# Patient Record
Sex: Female | Born: 1955 | Race: Black or African American | Hispanic: No | Marital: Married | Smoking: Never smoker
Health system: Southern US, Community
[De-identification: ages and names within clinical notes are randomized; demographics above are authoritative.]

## PROBLEM LIST (undated history)

## (undated) DIAGNOSIS — C73 Malignant neoplasm of thyroid gland: Secondary | ICD-10-CM

## (undated) DIAGNOSIS — E079 Disorder of thyroid, unspecified: Secondary | ICD-10-CM

## (undated) HISTORY — PX: THYROIDECTOMY: SHX17

---

## 2007-09-05 ENCOUNTER — Emergency Department (HOSPITAL_COMMUNITY): Admission: EM | Admit: 2007-09-05 | Discharge: 2007-09-05 | Payer: Self-pay | Admitting: Family Medicine

## 2010-11-08 ENCOUNTER — Other Ambulatory Visit (HOSPITAL_COMMUNITY): Payer: Self-pay | Admitting: Internal Medicine

## 2010-11-08 DIAGNOSIS — Z1231 Encounter for screening mammogram for malignant neoplasm of breast: Secondary | ICD-10-CM

## 2010-11-16 ENCOUNTER — Ambulatory Visit (HOSPITAL_COMMUNITY)
Admission: RE | Admit: 2010-11-16 | Discharge: 2010-11-16 | Disposition: A | Discharge status: Home / Self Care | Payer: Self-pay | Source: Ambulatory Visit | Attending: Internal Medicine | Admitting: Internal Medicine

## 2010-11-16 DIAGNOSIS — Z1231 Encounter for screening mammogram for malignant neoplasm of breast: Secondary | ICD-10-CM

## 2010-12-06 ENCOUNTER — Emergency Department (HOSPITAL_COMMUNITY): Payer: Self-pay

## 2010-12-06 ENCOUNTER — Emergency Department (HOSPITAL_COMMUNITY)
Admission: EM | Admit: 2010-12-06 | Discharge: 2010-12-06 | Disposition: A | Discharge status: Home / Self Care | Payer: Self-pay | Attending: Emergency Medicine | Admitting: Emergency Medicine

## 2010-12-06 DIAGNOSIS — S92919A Unspecified fracture of unspecified toe(s), initial encounter for closed fracture: Secondary | ICD-10-CM | POA: Insufficient documentation

## 2010-12-06 DIAGNOSIS — IMO0002 Reserved for concepts with insufficient information to code with codable children: Secondary | ICD-10-CM | POA: Insufficient documentation

## 2010-12-06 DIAGNOSIS — Z79899 Other long term (current) drug therapy: Secondary | ICD-10-CM | POA: Insufficient documentation

## 2010-12-06 DIAGNOSIS — Y9302 Activity, running: Secondary | ICD-10-CM | POA: Insufficient documentation

## 2010-12-06 DIAGNOSIS — E039 Hypothyroidism, unspecified: Secondary | ICD-10-CM | POA: Insufficient documentation

## 2010-12-06 DIAGNOSIS — W010XXA Fall on same level from slipping, tripping and stumbling without subsequent striking against object, initial encounter: Secondary | ICD-10-CM | POA: Insufficient documentation

## 2015-04-30 ENCOUNTER — Encounter (HOSPITAL_COMMUNITY): Payer: Self-pay | Admitting: *Deleted

## 2015-04-30 ENCOUNTER — Emergency Department (HOSPITAL_COMMUNITY): Payer: Non-veteran care

## 2015-04-30 DIAGNOSIS — Z85850 Personal history of malignant neoplasm of thyroid: Secondary | ICD-10-CM | POA: Diagnosis not present

## 2015-04-30 DIAGNOSIS — R109 Unspecified abdominal pain: Secondary | ICD-10-CM | POA: Insufficient documentation

## 2015-04-30 DIAGNOSIS — Z79899 Other long term (current) drug therapy: Secondary | ICD-10-CM | POA: Diagnosis not present

## 2015-04-30 DIAGNOSIS — E079 Disorder of thyroid, unspecified: Secondary | ICD-10-CM | POA: Insufficient documentation

## 2015-04-30 LAB — URINALYSIS, ROUTINE W REFLEX MICROSCOPIC
BILIRUBIN URINE: NEGATIVE
Glucose, UA: NEGATIVE mg/dL
HGB URINE DIPSTICK: NEGATIVE
KETONES UR: NEGATIVE mg/dL
Leukocytes, UA: NEGATIVE
Nitrite: NEGATIVE
Protein, ur: NEGATIVE mg/dL
SPECIFIC GRAVITY, URINE: 1.016 (ref 1.005–1.030)
UROBILINOGEN UA: 0.2 mg/dL (ref 0.0–1.0)
pH: 6 (ref 5.0–8.0)

## 2015-04-30 NOTE — ED Notes (Signed)
Pt c/o right sided flank pain intermitent x 1 week. States it got worse yesterday.

## 2015-05-01 ENCOUNTER — Emergency Department (HOSPITAL_COMMUNITY)
Admission: EM | Admit: 2015-05-01 | Discharge: 2015-05-01 | Disposition: A | Discharge status: Home / Self Care | Payer: Non-veteran care | Attending: Emergency Medicine | Admitting: Emergency Medicine

## 2015-05-01 ENCOUNTER — Emergency Department (HOSPITAL_COMMUNITY): Payer: Non-veteran care

## 2015-05-01 DIAGNOSIS — R109 Unspecified abdominal pain: Secondary | ICD-10-CM

## 2015-05-01 HISTORY — DX: Malignant neoplasm of thyroid gland: C73

## 2015-05-01 HISTORY — DX: Disorder of thyroid, unspecified: E07.9

## 2015-05-01 LAB — CBC WITH DIFFERENTIAL/PLATELET
BASOS PCT: 0 %
Basophils Absolute: 0 10*3/uL (ref 0.0–0.1)
Eosinophils Absolute: 0.1 10*3/uL (ref 0.0–0.7)
Eosinophils Relative: 1 %
HEMATOCRIT: 41.6 % (ref 36.0–46.0)
HEMOGLOBIN: 13.6 g/dL (ref 12.0–15.0)
Lymphocytes Relative: 44 %
Lymphs Abs: 3.9 10*3/uL (ref 0.7–4.0)
MCH: 28.1 pg (ref 26.0–34.0)
MCHC: 32.7 g/dL (ref 30.0–36.0)
MCV: 86 fL (ref 78.0–100.0)
MONOS PCT: 7 %
Monocytes Absolute: 0.7 10*3/uL (ref 0.1–1.0)
NEUTROS ABS: 4.2 10*3/uL (ref 1.7–7.7)
NEUTROS PCT: 47 %
Platelets: 292 10*3/uL (ref 150–400)
RBC: 4.84 MIL/uL (ref 3.87–5.11)
RDW: 14.4 % (ref 11.5–15.5)
WBC: 8.9 10*3/uL (ref 4.0–10.5)

## 2015-05-01 LAB — BASIC METABOLIC PANEL
ANION GAP: 9 (ref 5–15)
BUN: 13 mg/dL (ref 6–20)
CHLORIDE: 103 mmol/L (ref 101–111)
CO2: 25 mmol/L (ref 22–32)
CREATININE: 0.81 mg/dL (ref 0.44–1.00)
Calcium: 9.5 mg/dL (ref 8.9–10.3)
GFR calc non Af Amer: >60 mL/min (ref >60)
Glucose, Bld: 101 mg/dL — ABNORMAL HIGH (ref 65–99)
POTASSIUM: 3.8 mmol/L (ref 3.5–5.1)
SODIUM: 137 mmol/L (ref 135–145)

## 2015-05-01 MED ORDER — SODIUM CHLORIDE 0.9 % IV BOLUS (SEPSIS)
1000.0000 mL | Freq: Once | INTRAVENOUS | Status: AC
  Administered 2015-05-01: 1000 mL via INTRAVENOUS

## 2015-05-01 MED ORDER — IOHEXOL 350 MG/ML SOLN
100.0000 mL | Freq: Once | INTRAVENOUS | Status: AC | PRN
  Administered 2015-05-01: 100 mL via INTRAVENOUS

## 2015-05-01 NOTE — ED Notes (Signed)
Patient transported to CT 

## 2015-05-01 NOTE — ED Notes (Signed)
MD at bedside. 

## 2015-05-01 NOTE — ED Provider Notes (Signed)
CSN: [id number]   Arrival date & time 04/30/15 2120  History  By signing my name below, I, Altamease Oiler, attest that this documentation has been prepared under the direction and in the presence of Everlene Balls, MD. Electronically Signed: Altamease Oiler, ED Scribe. 05/01/2015. 1:36 AM.  Chief Complaint  Patient presents with  . Flank Pain    HPI The history is provided by the patient. No language interpreter was used.   Elaine Hunter is a 59 y.o. female who presents to the Emergency Department complaining of intermittent right flank pain with onset 1 week ago. The pain worsened yesterday and is described as burning. She rates the pain 7/10 in severity with no modifying factors. The pain does not radiate to the abdomen. She had similar pain 1 year ago that resolved spontaneously. No recent, trauma, fall, or strain. Pt denies fever, cough, chest pain, nausea, vomiting, diarrhea, dysuria, hematuria, or increased frequency.  Past Medical History  Diagnosis Date  . Thyroid disease   . Thyroid cancer [facility])     Past Surgical History  Procedure Laterality Date  . Thyroidectomy      No family history on file.  Social History  Substance Use Topics  . Smoking status: Never Smoker   . Smokeless tobacco: None  . Alcohol Use: No     Review of Systems  Constitutional: Negative for fever and chills.  Respiratory: Negative for cough.   Cardiovascular: Negative for chest pain.  Gastrointestinal: Negative for nausea, vomiting and diarrhea.  Genitourinary: Positive for flank pain. Negative for frequency and hematuria.  10 Systems reviewed and all are negative for acute change except as noted in the HPI. Home Medications   Prior to Admission medications   Medication Sig Start Date End Date Taking? Authorizing Provider  levothyroxine (SYNTHROID, LEVOTHROID) 100 MCG tablet Take 100 mcg by mouth daily before breakfast.   Yes Historical Provider, MD  Multiple Vitamin (MULTIVITAMIN WITH  MINERALS) TABS tablet Take 1 tablet by mouth daily.   Yes Historical Provider, MD    Allergies  Codeine  Triage Vitals: BP 127/91 mmHg  Pulse 92  Temp(Src) 98.4 F (36.9 C) (Oral)  Resp 16  SpO2 96%  Physical Exam  Constitutional: He is oriented to person, place, and time. Vital signs are normal. He appears well-developed and well-nourished.  Non-toxic appearance. He does not appear ill. No distress.  HENT:  Head: Normocephalic and atraumatic.  Nose: Nose normal.  Mouth/Throat: Oropharynx is clear and moist. No oropharyngeal exudate.  Eyes: Conjunctivae and EOM are normal. Pupils are equal, round, and reactive to light. No scleral icterus.  Neck: Normal range of motion. Neck supple. No tracheal deviation, no edema, no erythema and normal range of motion present. No thyroid mass and no thyromegaly present.  Cardiovascular: Normal rate, regular rhythm, S1 normal, S2 normal, normal heart sounds, intact distal pulses and normal pulses.  Exam reveals no gallop and no friction rub.   No murmur heard. Pulses:      Radial pulses are 2+ on the right side, and 2+ on the left side.       Dorsalis pedis pulses are 2+ on the right side, and 2+ on the left side.  Pulmonary/Chest: Effort normal and breath sounds normal. No respiratory distress. He has no wheezes. He has no rhonchi. He has no rales.  Abdominal: Soft. Normal appearance and bowel sounds are normal. She exhibits no distension, no ascites and no mass. There is no hepatosplenomegaly. There is tenderness. There is  no rebound, no guarding and no CVA tenderness.  Mid epigastric TTP  Musculoskeletal: Normal range of motion. He exhibits no edema or tenderness.  Lymphadenopathy:    He has no cervical adenopathy.  Neurological: He is alert and oriented to person, place, and time. He has normal strength. No cranial nerve deficit or sensory deficit.  Skin: Skin is warm, dry and intact. No petechiae and no rash noted. He is not diaphoretic. No  erythema. No pallor.  Psychiatric: He has a normal mood and affect. His behavior is normal. Judgment normal.  Nursing note and vitals reviewed.   ED Course  Procedures   DIAGNOSTIC STUDIES: Oxygen Saturation is 96% on RA, normal by my interpretation.    COORDINATION OF CARE: 1:31 AM Discussed treatment plan which includes lab work and renal US with pt at bedside and pt agreed to plan.  Labs Reviewed  BASIC METABOLIC PANEL - Abnormal; Notable for the following:    Glucose, Bld 101 (*)    All other components within normal limits  URINALYSIS, ROUTINE W REFLEX MICROSCOPIC (NOT AT [facility])  CBC WITH DIFFERENTIAL/PLATELET    Imaging Review US Renal  04/30/2015  CLINICAL DATA:  Right flank pain EXAM: RENAL / URINARY TRACT ULTRASOUND COMPLETE COMPARISON:  None. FINDINGS: Right Kidney: Length: 7.8 cm. Echogenicity within normal limits. No mass or hydronephrosis visualized. Left Kidney: Length: 9.8 cm. Echogenicity within normal limits. No mass or hydronephrosis visualized. Bladder: Appears normal for degree of bladder distention. IMPRESSION: Normal. Electronically Signed   By: Andreas Newport M.D.   On: 04/30/2015 23:25   Ct Cta Abd/pel W/cm &/or W/o Cm  05/01/2015  CLINICAL DATA:  Mid epigastric and flank pain radiating into the back EXAM: CTA ABDOMEN AND PELVIS wITHOUT AND WITH CONTRAST TECHNIQUE: Multidetector CT imaging of the abdomen and pelvis was performed using the standard protocol during bolus administration of intravenous contrast. Multiplanar reconstructed images and MIPs were obtained and reviewed to evaluate the vascular anatomy. CONTRAST:  80 mL Omnipaque 350 intravenous COMPARISON:  None. FINDINGS: The abdominal aorta is normal in caliber with minimal atherosclerotic calcification. There is no aneurysm. There is no dissection. There is no stenosis. The major branches of the aorta are widely patent. There are normal appearances of the liver, gallbladder, pancreas, spleen, adrenals  and kidneys. Collecting systems and ureters appear unremarkable. Urinary bladder is unremarkable. There are normal appearances of the stomach, small bowel and colon. The appendix is normal. There is hysterectomy. No adnexal abnormality. No significant musculoskeletal lesion. Lower lumbar facet arthritis is present from L4 through S1. No significant abnormality in the lower chest. Review of the MIP images confirms the above findings. IMPRESSION: Normal caliber abdominal aorta with minimal atherosclerotic calcification. No dissection. No aneurysm. No acute findings are evident in the abdomen or pelvis. Lower lumbar facet arthritis. Electronically Signed   By: Andreas Newport M.D.   On: 05/01/2015 04:55    I personally reviewed and evaluated these images and lab results as a part of my medical decision-making.    MDM   Final diagnoses:  None    patient presents to the emergency department for right-sided flank pain for 1 week. She states is continue to get worse. Renal ultrasound and laboratory studies are unremarkable. AAA or aortic leak is a possibility, will obtain CT angiogram abdomen and pelvis for evaluation. Patient is not requesting anything for pain control. 1 L IV fluids given.   CTA was negative for aortic pathology. Patient continues to appear well in the  emergency department overnight. She was advised to take ibuprofen or tylenol as needed for pain and see a PCP within 3 days for close follow up.  She is in NAD.  VS remain within her normal limits and she is safe for DC.   Angiocath insertion Performed by: Everlene Balls  Consent: Verbal consent obtained. Risks and benefits: risks, benefits and alternatives were discussed Time out: Immediately prior to procedure a "time out" was called to verify the correct patient, procedure, equipment, support staff and site/side marked as required.  Preparation: Patient was prepped and draped in the usual sterile fashion.  Vein Location: R  basilic vein  Ultrasound Guided  Gauge: 20G  Normal blood return and flush without difficulty Patient tolerance: Patient tolerated the procedure well with no immediate complications.      I personally performed the services described in this documentation, which was scribed in my presence. The recorded information has been reviewed and is accurate.      Everlene Balls, MD 05/01/15 [phone]

## 2015-05-01 NOTE — ED Notes (Signed)
RN attempted x 2 for IV; 2nd RN to attempt IV start

## 2015-05-01 NOTE — Discharge Instructions (Signed)
Flank Pain Ms. Russman, your CT scan did not show any findings in your abdomen to explain your pain.  Take tylenol or ibuprofen as needed for pain control, and see your primary doctor within 3 days for close follow up.   If any symptoms worsen come back to the emergency department immediately. Thank you. Flank pain is pain in your side. The flank is the area of your side between your upper belly (abdomen) and your back. Pain in this area can be caused by many different things. [place] care and treatment will depend on the cause of your pain.  Rest as told by your doctor.  Drink enough fluids to keep your pee (urine) clear or pale yellow.  Only take medicine as told by your doctor.  Tell your doctor about any changes in your pain.  Follow up with your doctor. GET HELP RIGHT AWAY IF:   Your pain does not get better with medicine.   You have new symptoms or your symptoms get worse.  Your pain gets worse.   You have belly (abdominal) pain.   You are short of breath.   You always feel sick to your stomach (nauseous).   You keep throwing up (vomiting).   You have puffiness (swelling) in your belly.   You feel light-headed or you pass out (faint).   You have blood in your pee.  You have a fever or lasting symptoms for more than 2-3 days.  You have a fever and your symptoms suddenly get worse. MAKE SURE YOU:   Understand these instructions.  Will watch your condition.  Will get help right away if you are not doing well or get worse.   This information is not intended to replace advice given to you by your health care provider. Make sure you discuss any questions you have with your health care provider.   Document Released: 03/22/2008 Document Revised: 07/04/2014 Document Reviewed: 01/26/2012 Elsevier Interactive Patient Education 2016 Elsevier Inc[employer]  Abdominal Pain, Adult Many things can cause belly (abdominal) pain. Most times, the belly pain is not  dangerous. Many cases of belly pain can be watched and treated at home. HOME CARE   Do not take medicines that help you go poop (laxatives) unless told to by your doctor.  Only take medicine as told by your doctor.  Eat or drink as told by your doctor. Your doctor will tell you if you should be on a special diet. GET HELP IF:  You do not know what is causing your belly pain.  You have belly pain while you are sick to your stomach (nauseous) or have runny poop (diarrhea).  You have pain while you pee or poop.  Your belly pain wakes you up at night.  You have belly pain that gets worse or better when you eat.  You have belly pain that gets worse when you eat fatty foods.  You have a fever. GET HELP RIGHT AWAY IF:   The pain does not go away within 2 hours.  You keep throwing up (vomiting).  The pain changes and is only in the right or left part of the belly.  You have bloody or tarry looking poop. MAKE SURE YOU:   Understand these instructions.  Will watch your condition.  Will get help right away if you are not doing well or get worse.   This information is not intended to replace advice given to you by your health care provider. Make sure you discuss any  questions you have with your health care provider.   Document Released: 11/30/2007 Document Revised: 07/04/2014 Document Reviewed: 02/20/2013 Elsevier Interactive Patient Education [employer].

## 2016-05-03 ENCOUNTER — Encounter (HOSPITAL_COMMUNITY): Payer: Self-pay | Admitting: *Deleted

## 2016-05-03 ENCOUNTER — Emergency Department (HOSPITAL_COMMUNITY): Payer: Non-veteran care

## 2016-05-03 ENCOUNTER — Emergency Department (HOSPITAL_COMMUNITY)
Admission: EM | Admit: 2016-05-03 | Discharge: 2016-05-03 | Disposition: A | Discharge status: Home / Self Care | Payer: Non-veteran care | Attending: Emergency Medicine | Admitting: Emergency Medicine

## 2016-05-03 DIAGNOSIS — Z85850 Personal history of malignant neoplasm of thyroid: Secondary | ICD-10-CM | POA: Diagnosis not present

## 2016-05-03 DIAGNOSIS — R109 Unspecified abdominal pain: Secondary | ICD-10-CM | POA: Diagnosis present

## 2016-05-03 LAB — URINALYSIS, ROUTINE W REFLEX MICROSCOPIC
BILIRUBIN URINE: NEGATIVE
Glucose, UA: NEGATIVE mg/dL
Hgb urine dipstick: NEGATIVE
KETONES UR: NEGATIVE mg/dL
LEUKOCYTES UA: NEGATIVE
NITRITE: NEGATIVE
PH: 6 (ref 5.0–8.0)
PROTEIN: NEGATIVE mg/dL
Specific Gravity, Urine: 1.013 (ref 1.005–1.030)

## 2016-05-03 MED ORDER — KETOROLAC TROMETHAMINE 60 MG/2ML IM SOLN
30.0000 mg | Freq: Once | INTRAMUSCULAR | Status: AC
  Administered 2016-05-03: 30 mg via INTRAMUSCULAR
  Filled 2016-05-03: qty 2

## 2016-05-03 MED ORDER — KETOROLAC TROMETHAMINE 30 MG/ML IJ SOLN: 30.0000 mg | Freq: Once | INTRAMUSCULAR | Status: AC

## 2016-05-03 MED ORDER — NAPROXEN 500 MG PO TABS: 500.0000 mg | ORAL_TABLET | Freq: Two times a day (BID) | ORAL | 0 refills | Status: DC | PRN

## 2016-05-03 NOTE — ED Provider Notes (Signed)
[facility] DEPT Provider Note   CSN: [id number] Arrival date & time: 05/03/16  1651     History   Chief Complaint Chief Complaint  Patient presents with  . Flank Pain    HPI Elaine Hunter is a 60 y.o. female.  The history is provided by the patient and medical records. No language interpreter was used.     Elaine Hunter is a 60 y.o. female  with a PMH of thyroid disease who presents to the Emergency Department complaining of intermittent aching right-sided back pain 2 weeks. Patient states pain improved for 4-5 days, then returning 4-5 days ago. This morning, she felt as if pain was radiating to her flank. She has taken no medications prior to arrival for her symptoms. No alleviating or aggravating factors were noted. No dysuria, urinary urgency, urinary frequency, fevers, abdominal pain. No increase in activity or injury. No history of similar sxs.   Past Medical History:  Diagnosis Date  . Thyroid cancer ([place])   . Thyroid disease     There are no active problems to display for this patient.   Past Surgical History:  Procedure Laterality Date  . THYROIDECTOMY      OB History    No data available       Home Medications    Prior to Admission medications   Medication Sig Start Date End Date Taking? Authorizing Provider  cholecalciferol (VITAMIN D) 1000 units tablet Take 1,000 Units by mouth daily.   Yes Historical Provider, MD  hydrocortisone 2.5 % cream Apply 1 application topically daily as needed (eczema). Apply once daily as needed to eczema on face & hairline   Yes Historical Provider, MD  ketoconazole (NIZORAL) 2 % shampoo Apply 1 application topically daily as needed for irritation (eczema). Wash hair and face (per pt) with shampoo once daily as needed for eczema relief   Yes Historical Provider, MD  levothyroxine (SYNTHROID, LEVOTHROID) 100 MCG tablet Take 100 mcg by mouth daily before breakfast.   Yes Historical Provider, MD  Multiple Vitamin  (MULTIVITAMIN WITH MINERALS) TABS tablet Take 1 tablet by mouth daily.   Yes Historical Provider, MD  PRESCRIPTION MEDICATION Place 2 sprays into both nostrils at bedtime.   Yes Historical Provider, MD  PRESCRIPTION MEDICATION Take 1 tablet by mouth at bedtime. Take 1 tablet by mouth at bedtime for allergies   Yes Historical Provider, MD  naproxen (NAPROSYN) 500 MG tablet Take 1 tablet (500 mg total) by mouth 2 (two) times daily as needed. 05/03/16   [place], PA-C    Family History No family history on file.  Social History Social History  Substance Use Topics  . Smoking status: Never Smoker  . Smokeless tobacco: Never Used  . Alcohol use No     Allergies   Codeine   Review of Systems Review of Systems  Constitutional: Negative for chills and fever.  HENT: Negative for congestion.   Eyes: Negative for visual disturbance.  Respiratory: Negative for shortness of breath.   Cardiovascular: Negative for chest pain.  Gastrointestinal: Negative for abdominal pain, blood in stool, constipation, diarrhea, nausea and vomiting.  Genitourinary: Positive for flank pain. Negative for dysuria, frequency, urgency and vaginal discharge.  Musculoskeletal: Positive for back pain. Negative for neck pain.  Skin: Negative for rash.  Neurological: Negative for headaches.     Physical Exam Updated Vital Signs BP 121/71 (BP Location: Right Wrist)   Pulse 81   Temp 98.5 F (36.9 C) (Oral)  Resp 17   Ht 5\' 3"  (1.6 m)   Wt 104.5 kg   SpO2 96%   BMI 40.81 kg/m   Physical Exam  Constitutional: She is oriented to person, place, and time. She appears well-developed and well-nourished. No distress.  HENT:  Head: Normocephalic and atraumatic.  Cardiovascular: Normal rate, regular rhythm, normal heart sounds and intact distal pulses.  Exam reveals no gallop and no friction rub.   No murmur heard. Pulmonary/Chest: Effort normal and breath sounds normal. No respiratory distress. She has  no wheezes. She has no rales. She exhibits no tenderness.  Abdominal: Soft. Bowel sounds are normal. She exhibits no distension. There is no tenderness.  Musculoskeletal: She exhibits no edema.  Tenderness to palpation of right low back and right flank. No CVA tenderness. No midline C/T/L tenderness. Straight-leg raise is negative bilaterally for radicular symptoms. 5/5 muscle strength in all 4 extremities.  Neurological: She is alert and oriented to person, place, and time.  Bilateral lower extremities neurovascularly intact.   Skin: Skin is warm and dry.  Nursing note and vitals reviewed.    ED Treatments / Results  Labs (all labs ordered are listed, but only abnormal results are displayed) Labs Reviewed  URINALYSIS, ROUTINE W REFLEX MICROSCOPIC (NOT AT [facility])    EKG  EKG Interpretation None       Radiology Ct Renal Stone Study  Result Date: 05/03/2016 CLINICAL DATA:  Right flank pain radiating to the right abdomen for the past 2 weeks worsening over the past 3 days. No diarrhea emesis. Denies urinary symptoms. EXAM: CT ABDOMEN AND PELVIS WITHOUT CONTRAST TECHNIQUE: Multidetector CT imaging of the abdomen and pelvis was performed following the standard protocol without IV contrast. COMPARISON:  05/01/2015 FINDINGS: Lower chest: There is bibasilar lower lobe with subsegmental atelectasis. No effusion or pneumothorax. The visualized cardiac chambers are normal in size. No pericardial effusion. Small hiatal hernia. Hepatobiliary: No focal liver abnormality is seen. No gallstones, gallbladder wall thickening, or biliary dilatation. Pancreas: No pancreatic ductal dilatation or apparent mass given limitations of this noncontrast study. Spleen: No splenomegaly. Adrenals/Urinary Tract: The adrenal glands are normal. The kidneys demonstrate no obstructive uropathy or apparent mass. No nephrolithiasis. Stomach/Bowel: The stomach is decompressed in appearance. Normal bowel rotation is seen. There  is a moderate amount of fecal retention within large bowel without of evidence of obstruction. No acute inflammatory process. Normal air-filled appendix. Vascular/Lymphatic: Minimal atherosclerosis of the normal calibered infrarenal aorta. No aneurysm. Reproductive: Hysterectomy. No adnexal mass. Stable calcifications noted along the course the broad ligament bilaterally. Other: No abdominal wall hernia or abnormality. No abdominopelvic ascites. Musculoskeletal: No suspicious osteolytic or blastic disease. There appears to be lumbarization of S1 with pseudojoint formation with the sacral ala bilaterally. Lower lumbar facet arthropathy L4 through S1 as before. IMPRESSION: Small hiatal hernia. No obstructive uropathy or nephrolithiasis. Lower lumbar degenerative facet arthropathy. No acute intra-abdominal or pelvic findings. Electronically Signed   By: Ashley Royalty M.D.   On: 05/03/2016 20:22    Procedures Procedures (including critical care time)  Medications Ordered in ED Medications  ketorolac (TORADOL) 30 MG/ML injection 30 mg ( Intravenous See Alternative 05/03/16 1921)    Or  ketorolac (TORADOL) injection 30 mg (30 mg Intramuscular Given 05/03/16 1921)     Initial Impression / Assessment and Plan / ED Course  I have reviewed the triage vital signs and the nursing notes.  Pertinent labs & imaging results that were available during my care of the patient were reviewed  by me and considered in my medical decision making (see chart for details).  Clinical Course    Elaine Hunter is a 60 y.o. female who presents to ED for intermittent right lower back pain x 2 weeks that began to radiate to right flank today. On exam, patient is afebrile, nontoxic appearing and hemodynamically stable. No CVA tenderness and benign abdominal exam. She does exhibit tenderness to the right lumbar area and right flank. UA obtained with no signs of infection. CT renal study shows a small hiatal hernia, but no acute  findings. Evaluation does not show pathology that would require ongoing emergent intervention or inpatient treatment. Symptomatic home care instructions discussed. Rx for naproxen given. PCP follow up strongly encourage. Return precautions discussed and all questions answered.     Final Clinical Impressions(s) / ED Diagnoses   Final diagnoses:  Flank pain    New Prescriptions Discharge Medication List as of 05/03/2016  8:51 PM    START taking these medications   Details  naproxen (NAPROSYN) 500 MG tablet Take 1 tablet (500 mg total) by mouth 2 (two) times daily as needed., Starting Tue 05/03/2016, Print         [employer] [place], PA-C 05/03/16 2159    Sherwood Gambler, MD 05/04/16 [phone]

## 2016-05-03 NOTE — ED Triage Notes (Signed)
Pt complains of pain in right flank radiating to her right abdomen for the past 2 weeks which became worse for the past 3 days. Pt denies emesis or diarrhea. Pt denies urinary symptoms.

## 2016-05-03 NOTE — Discharge Instructions (Signed)
Take naproxen as needed for pain. Follow up with your primary care provider if symptoms do not improve in 1 week.   Please seek immediate care if you develop any of the following symptoms: The pain does not go away.  You have a fever.  You keep throw up (vomit). You pass bloody or black tarry stools.  There is bright red blood in the stool.  There is burning when you urinate There is rectal pain.  You do not seem to be getting better.  You have any questions or concerns.

## 2016-05-03 NOTE — ED Notes (Signed)
ED Provider at bedside. 

## 2016-09-05 ENCOUNTER — Ambulatory Visit (INDEPENDENT_AMBULATORY_CARE_PROVIDER_SITE_OTHER): Payer: Non-veteran care

## 2016-09-05 ENCOUNTER — Encounter: Payer: Self-pay | Admitting: Podiatry

## 2016-09-05 ENCOUNTER — Ambulatory Visit (INDEPENDENT_AMBULATORY_CARE_PROVIDER_SITE_OTHER): Payer: Non-veteran care | Admitting: Podiatry

## 2016-09-05 DIAGNOSIS — L600 Ingrowing nail: Secondary | ICD-10-CM

## 2016-09-05 DIAGNOSIS — B351 Tinea unguium: Secondary | ICD-10-CM

## 2016-09-05 DIAGNOSIS — R52 Pain, unspecified: Secondary | ICD-10-CM

## 2016-09-05 MED ORDER — HYDROCODONE-ACETAMINOPHEN 5-325 MG PO TABS: 1.0000 | ORAL_TABLET | Freq: Four times a day (QID) | ORAL | 0 refills | Status: DC | PRN

## 2016-09-05 NOTE — Patient Instructions (Signed)

## 2016-09-05 NOTE — Progress Notes (Signed)
   Subjective:    Patient ID: Elaine Hunter, female    DOB: 07-Mar-1956, 61 y.o.   MRN: [id number]  HPI  61 year old feme today for concerns  Of an ingrown toenail to the lateral portion of the left big toe which is been ongoing for several years. She has had 6 previous procedures done to the toenail. She is requesting that the lateral portion of the nail be removed. It is painful with pressure and shoes. No surrounding redness or drainage or any other signs of infection. No other complaints today. Review of Systems  HENT: Positive for sinus pressure.        Ringing in ears  Endocrine: Positive for cold intolerance.  Musculoskeletal:       Muscle pain  All other systems reviewed and are negative.      Objective:   Physical Exam General: AAO x3, NAD  Dermatological: Incurvation along the lateral portion of the left hallux toenail with tenderness to palpation. There is no surrounding redness, drainage, or signs of infection. No erythema or ascending cellulitis. No open lesoins.   Vascular: Dorsalis Pedis artery and Posterior Tibial artery pedal pulses are 2/4 bilateral with immedate capillary fill time. No varicosities and no lower extremity edema present bilateral. There is no pain with calf compression, swelling, warmth, erythema.   Neruologic: Grossly intact via light touch bilateral. Vibratory intact via tuning fork bilateral. Protective threshold with Semmes Wienstein monofilament intact to all pedal sites bilateral.   Musculoskeletal: No gross boney pedal deformities bilateral. No pain, crepitus, or limitation noted with foot and ankle range of motion bilateral. Muscular strength 5/5 in all groups tested bilateral.  Gait: Unassisted, Nonantalgic.      Assessment & Plan:  61 year old female left lateral hallux symptomatic ingrown toenail -Treatment options discussed including all alternatives, risks, and complications -Etiology of symptoms were discussed -At this time, the  patient is requesting partial nail removal with chemical matricectomy to the symptomatic portion of the nail. She wishes to hold off on any total nail removal. Risks and complications were discussed with the patient for which they understand and  verbally consent to the procedure. Under sterile conditions a total of 3 mL of a mixture of 2% lidocaine plain and 0.5% Marcaine plain was infiltrated in a hallux block fashion. Once anesthetized, the skin was prepped in sterile fashion. A tourniquet was then applied. Next the lateral aspect of hallux nail border was then sharply excised making sure to remove the entire offending nail border. Once the nails were ensured to be removed area was debrided and the underlying skin was intact. There is no purulence identified in the procedure. Next phenol was then applied under standard conditions and copiously irrigated. Silvadene was applied. A dry sterile dressing was applied. After application of the dressing the tourniquet was removed and there is found to be an immediate capillary refill time to the digit. The patient tolerated the procedure well any complications. Post procedure instructions were discussed the patient for which he verbally understood. Follow-up in one week for nail check or sooner if any problems are to arise. Discussed signs/symptoms of infection and directed to call the office immediately should any occur or go directly to the emergency room. In the meantime, encouraged to call the office with any questions, concerns, changes symptoms. -Nail sent for biopsy to [facility] labs.   Celesta Gentile, DPM

## 2016-09-19 ENCOUNTER — Ambulatory Visit (INDEPENDENT_AMBULATORY_CARE_PROVIDER_SITE_OTHER): Payer: Non-veteran care | Admitting: Podiatry

## 2016-09-19 ENCOUNTER — Encounter: Payer: Self-pay | Admitting: Podiatry

## 2016-09-19 DIAGNOSIS — L600 Ingrowing nail: Secondary | ICD-10-CM | POA: Diagnosis not present

## 2016-09-19 DIAGNOSIS — L603 Nail dystrophy: Secondary | ICD-10-CM | POA: Diagnosis not present

## 2016-09-19 NOTE — Progress Notes (Signed)
Subjective: Elaine Hunter is a 61 y.o.  female returns to office today for follow up evaluation after having the left Hallux lateral nail avulsion performed. Patient has been soaking using epsom salts and applying topical antibiotic covered with bandaid daily. She states she gets some occasional clear to bloody drainage although minimal. The pain has improved. Patient denies fevers, chills, nausea, vomiting. Denies any calf pain, chest pain, SOB.   Objective:  Vitals: Reviewed  General: Well developed, nourished, in no acute distress, alert and oriented x3   Dermatology: Skin is warm, dry and supple bilateral. Lateral hallux nail border appears to be clean, dry, with mild granular tissue and surrounding scab. There is no surrounding erythema, edema, drainage/purulence. The remaining nails appear unremarkable at this time. There are no other lesions or other signs of infection present.  Neurovascular status: Intact. No lower extremity swelling; No pain with calf compression bilateral.  Musculoskeletal: Decreased tenderness to palpation of the lateral hallux nail fold. Muscular strength within normal limits bilateral.   Assesement and Plan: S/p partial nail avulsion, doing well.   -Continue soaking in epsom salts twice a day followed by antibiotic ointment and a band-aid. Can leave uncovered at night. Continue this until completely healed.  -If the area has not healed in 2 weeks, call the office for follow-up appointment, or sooner if any problems arise.  -Nail culture discussed with the patient today. Negative for fungus.  -Monitor for any signs/symptoms of infection. Call the office immediately if any occur or go directly to the emergency room. Call with any questions/concerns.  Celesta Gentile, DPM

## 2016-09-19 NOTE — Patient Instructions (Signed)

## 2016-12-06 ENCOUNTER — Encounter: Payer: Self-pay | Admitting: Podiatry

## 2016-12-06 ENCOUNTER — Ambulatory Visit (INDEPENDENT_AMBULATORY_CARE_PROVIDER_SITE_OTHER): Payer: Non-veteran care | Admitting: Podiatry

## 2016-12-06 DIAGNOSIS — L600 Ingrowing nail: Secondary | ICD-10-CM

## 2016-12-06 MED ORDER — NEOMYCIN-POLYMYXIN-HC 1 % OT SOLN: OTIC | 1 refills | Status: DC

## 2016-12-06 NOTE — Patient Instructions (Signed)

## 2016-12-06 NOTE — Progress Notes (Signed)
She presents with a chief complaint of a toenail is then worked on before she states that only half the toenail remains in hangs only socks and shoes. I would like to have it removed if possible she refers to the hallux left.  Objective: Vital signs are stable alert and oriented 3 pulses are palpable. Half toenail is still present to the hallux left. This is mildly tender on palpation. No signs of infection.  Assessment: Painful thickened nail hallux right.  Plan: Nail avulsion with matrixectomy hallux right. She tolerated procedure well was provided with both oral and written home-going instructions for care and soaking of her toe as well as a prescription for Cortisporin Otic to be applied. Follow up with her in 2 weeks.

## 2017-01-10 ENCOUNTER — Encounter: Payer: Self-pay | Admitting: Podiatry

## 2017-01-10 ENCOUNTER — Ambulatory Visit (INDEPENDENT_AMBULATORY_CARE_PROVIDER_SITE_OTHER): Payer: Self-pay | Admitting: Podiatry

## 2017-01-10 DIAGNOSIS — L600 Ingrowing nail: Secondary | ICD-10-CM

## 2017-01-10 NOTE — Progress Notes (Signed)
She presents today for follow-up of her matrixectomy of her remaining portion of nail hallux left. She states that she is doing very well she states that there still some drainage she gets shooting pain every once a while. She continues to soak twice daily Epsom salts and warm water.  Objective: Vital signs are stable she is alert and oriented 3 pulses remain comparable to the left foot. There is no signs of infection. There is some serosanguineous drainage but all in all it appears to be healing very nicely.  Assessment: Matrixectomy hallux left.  Plan: Chemical matrixectomy is healing well discontinue daily soaks*with every other day soaks covered in the daytime and leave open at bedtime. Continue to do this until is completely well.

## 2018-02-01 IMAGING — CT CT RENAL STONE PROTOCOL
2 series · 11 of 38 positions shown, 12 images · non-contrast
Comparison: 05/01/2015

CLINICAL DATA: Right flank pain radiating to the right abdomen for
the past 2 weeks worsening over the past 3 days. No diarrhea emesis.
Denies urinary symptoms.

EXAM:
CT ABDOMEN AND PELVIS WITHOUT CONTRAST
TECHNIQUE: Multidetector CT imaging of the abdomen and pelvis was performed
following the standard protocol without IV contrast.

[Series 3: coronal · coronal · 0.90mm/px · 3 of 156 slices shown]
[im 69/156  soft-tissue]
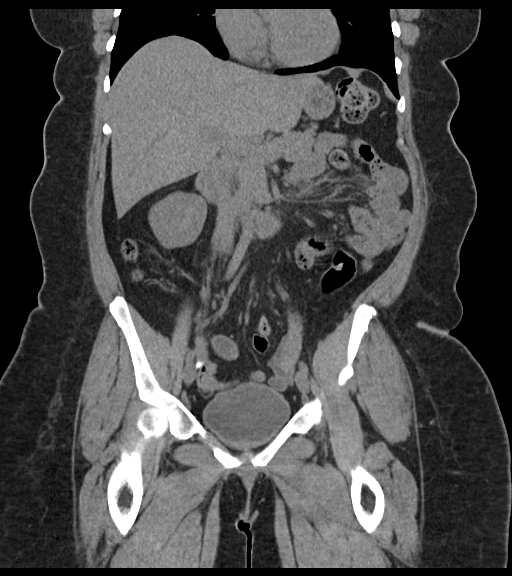
[im 78/156  soft-tissue]
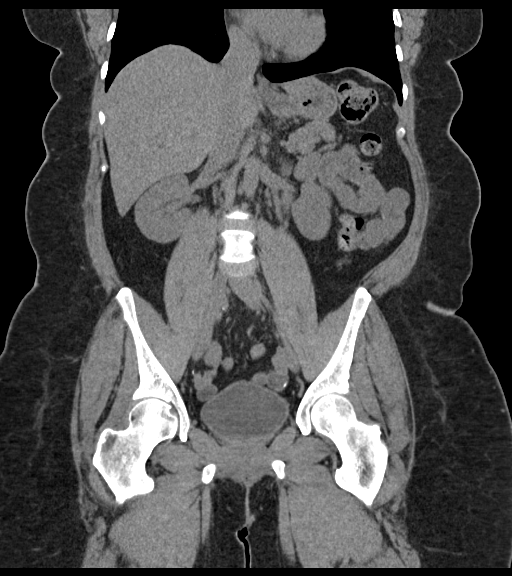
[im 87/156  soft-tissue]
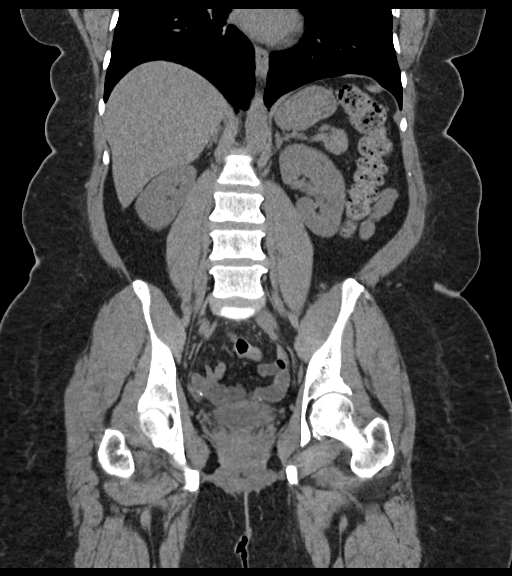

[Series 4: sagittal · sagittal · 0.66mm/px · 8 of 217 slices shown, 9 images]
[im 25/217  soft-tissue]
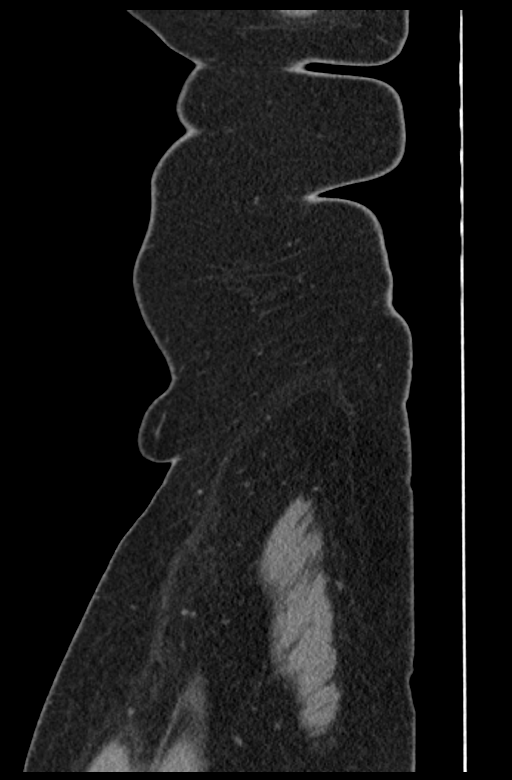
[im 25/217  bone]
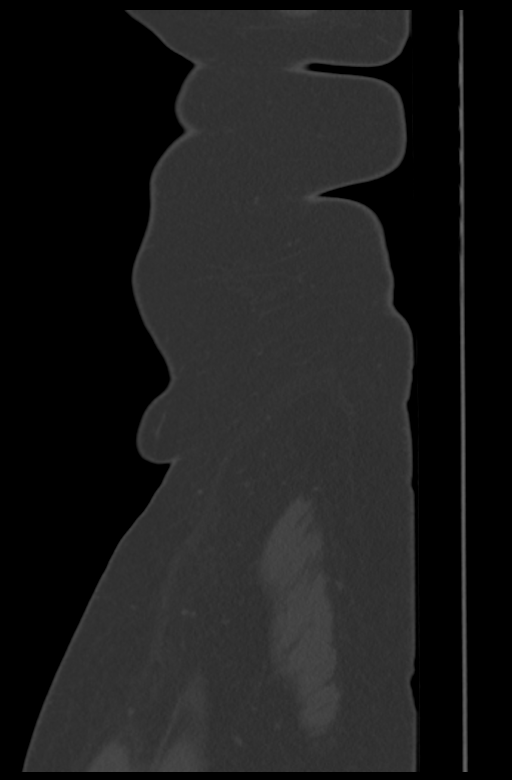
[im 49/217  soft-tissue]
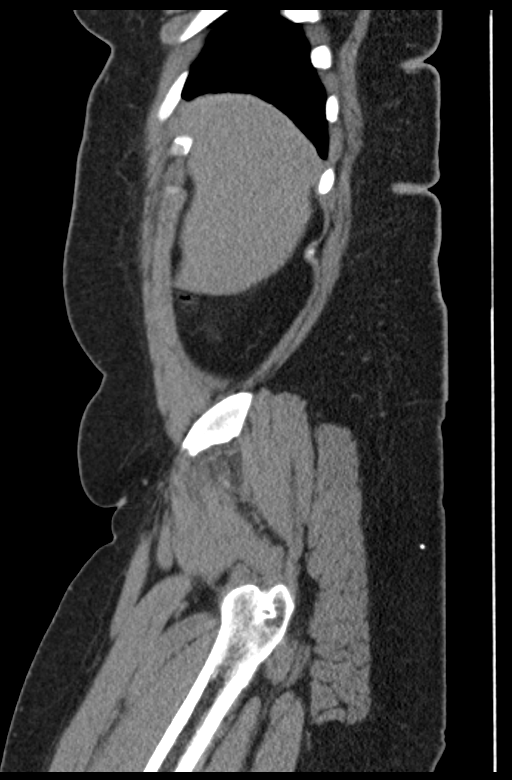
[im 73/217  soft-tissue]
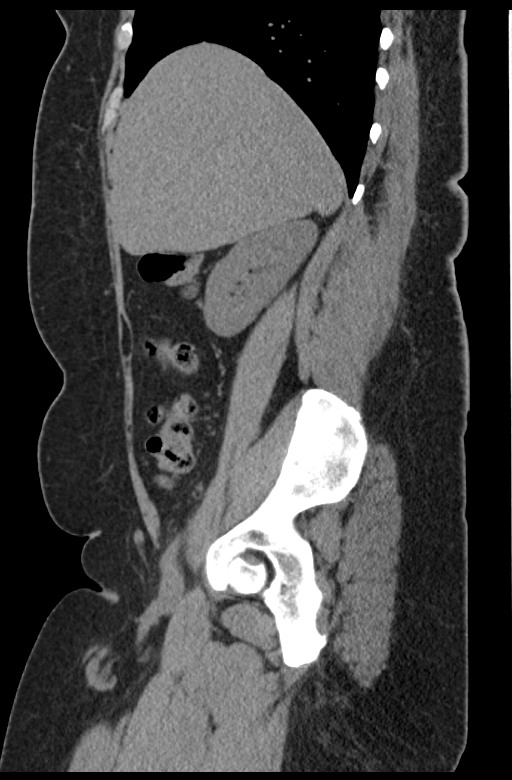
[im 97/217  soft-tissue]
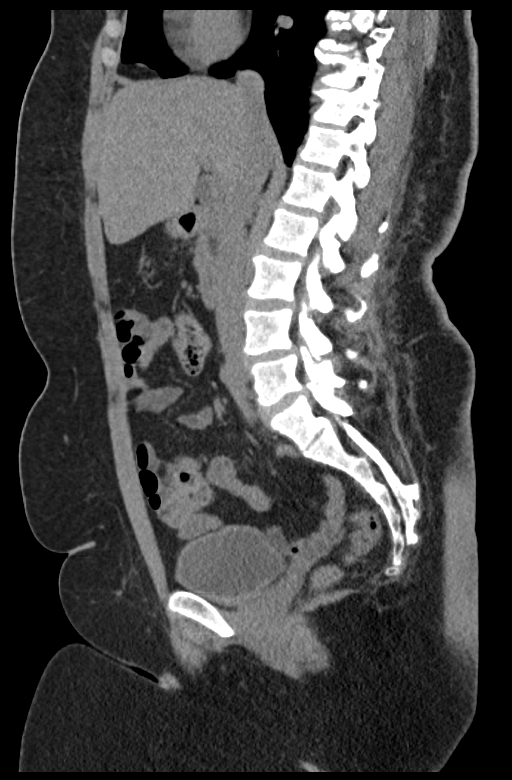
[im 121/217  soft-tissue]
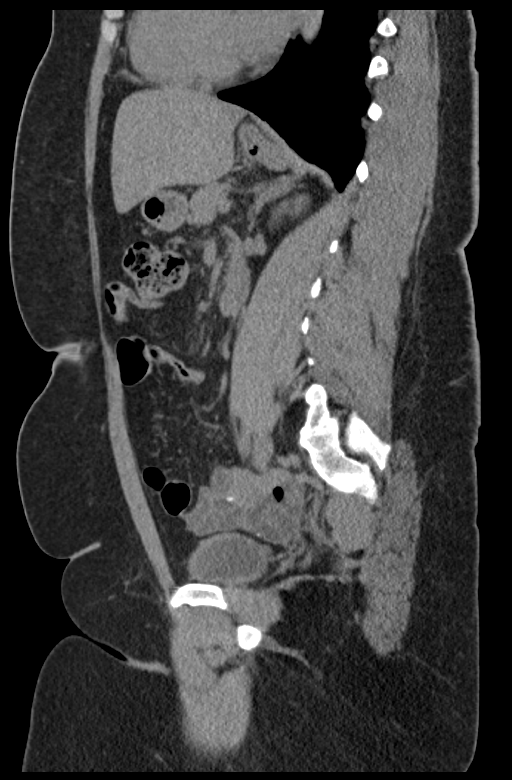
[im 145/217  soft-tissue]
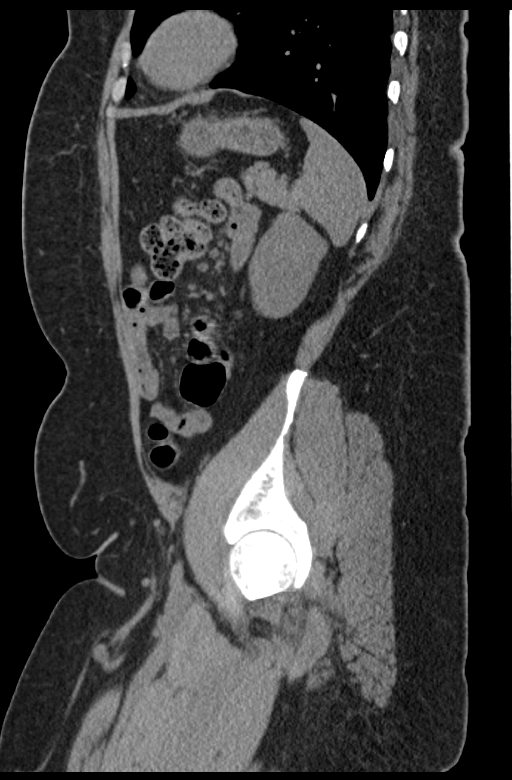
[im 169/217  soft-tissue]
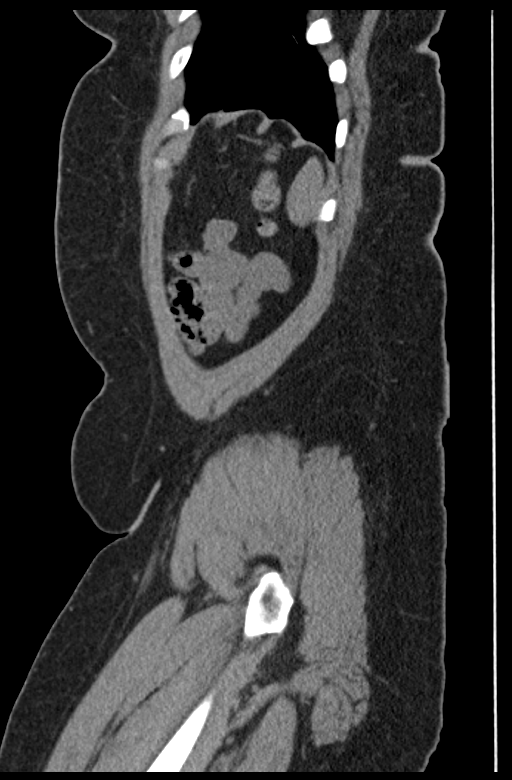
[im 193/217  soft-tissue]
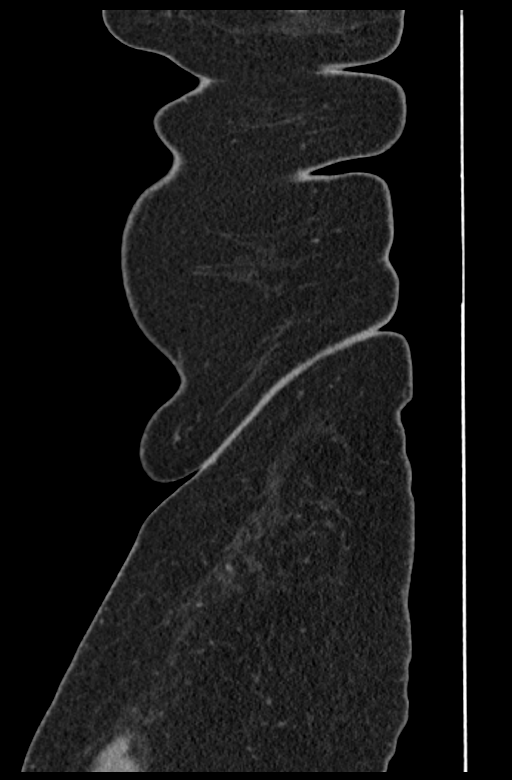

[11 of 38 positions shown; findings below may reference images not displayed]

FINDINGS: Lower chest: There is bibasilar lower lobe with subsegmental
atelectasis. No effusion or pneumothorax. The visualized cardiac
chambers are normal in size. No pericardial effusion. Small hiatal
hernia.

Hepatobiliary: No focal liver abnormality is seen. No gallstones,
gallbladder wall thickening, or biliary dilatation.

Pancreas: No pancreatic ductal dilatation or apparent mass given
limitations of this noncontrast study.

Spleen: No splenomegaly.

Adrenals/Urinary Tract: The adrenal glands are normal. The kidneys
demonstrate no obstructive uropathy or apparent mass. No
nephrolithiasis.

Stomach/Bowel: The stomach is decompressed in appearance. Normal
bowel rotation is seen. There is a moderate amount of fecal
retention within large bowel without of evidence of obstruction. No
acute inflammatory process. Normal air-filled appendix.

Vascular/Lymphatic: Minimal atherosclerosis of the normal calibered
infrarenal aorta. No aneurysm.

Reproductive: Hysterectomy. No adnexal mass. Stable calcifications
noted along the course the broad ligament bilaterally.

Other: No abdominal wall hernia or abnormality. No abdominopelvic
ascites.

Musculoskeletal: No suspicious osteolytic or blastic disease. There
appears to be lumbarization of S1 with pseudojoint formation with
the sacral ala bilaterally. Lower lumbar facet arthropathy L4
through S1 as before.
IMPRESSION: Small hiatal hernia. No obstructive uropathy or nephrolithiasis.
Lower lumbar degenerative facet arthropathy. No acute
intra-abdominal or pelvic findings.

## 2018-08-20 ENCOUNTER — Institutional Professional Consult (permissible substitution): Payer: Non-veteran care | Admitting: Neurology

## 2018-09-19 ENCOUNTER — Encounter: Payer: Self-pay | Admitting: Neurology

## 2018-09-19 ENCOUNTER — Telehealth: Payer: Self-pay | Admitting: Neurology

## 2018-09-19 NOTE — Telephone Encounter (Signed)
Called the patient to inform them that our office has placed new protocols in place for our office visits. Due to the virus pandemic our office is reducing our number of office visits in order to minimize the risk to our patients and healthcare providers. Advised that our office is now providing the capability to offer the patients phone visits at this time. Informed of what that process looks like and informed that the telephone office visit will still be billed through insurance and due to [place] we need them to know since the appointment is taking place over the phone, we can't guarantee the security of the phone line. With that said if we do move forward I would have to get verbal consent to completed the call over the phone. Pt gave verbalized consent to complete the visit online/telephone. I have reviewed with her to the patient's best capability her chart and updated medication, allergies and history. Patient was at work and was unable to complete fully since she wasn't at home with her medication. She is a [place] patient and she questioned the visit being billed with [place] and I reviewed with Angie in billing you states that this visit can still take place. I reviewed with the patient and had her write down the app to download for the video visit. I got her email [email or web address]. advised the patient that I will send a email and for the patient to be ready for her apt a little before her apt time and just join meeting. Pt verbalized understanding and was appreciative. Advised the patient to call if she had any questions.

## 2018-09-20 ENCOUNTER — Other Ambulatory Visit: Payer: Self-pay

## 2018-09-20 ENCOUNTER — Ambulatory Visit (INDEPENDENT_AMBULATORY_CARE_PROVIDER_SITE_OTHER): Payer: No Typology Code available for payment source | Admitting: Neurology

## 2018-09-20 DIAGNOSIS — G4710 Hypersomnia, unspecified: Secondary | ICD-10-CM

## 2018-09-20 DIAGNOSIS — G4719 Other hypersomnia: Secondary | ICD-10-CM | POA: Diagnosis not present

## 2018-09-20 DIAGNOSIS — G4709 Other insomnia: Secondary | ICD-10-CM | POA: Diagnosis not present

## 2018-09-20 DIAGNOSIS — E6609 Other obesity due to excess calories: Secondary | ICD-10-CM

## 2018-09-20 DIAGNOSIS — Z8659 Personal history of other mental and behavioral disorders: Secondary | ICD-10-CM

## 2018-09-20 DIAGNOSIS — Z6838 Body mass index (BMI) 38.0-38.9, adult: Secondary | ICD-10-CM

## 2018-09-20 DIAGNOSIS — G4733 Obstructive sleep apnea (adult) (pediatric): Secondary | ICD-10-CM

## 2018-09-20 DIAGNOSIS — Z87898 Personal history of other specified conditions: Secondary | ICD-10-CM

## 2018-09-20 DIAGNOSIS — Z9989 Dependence on other enabling machines and devices: Secondary | ICD-10-CM

## 2018-09-20 MED ORDER — TRAZODONE HCL 50 MG PO TABS: 50.0000 mg | ORAL_TABLET | Freq: Every day | ORAL | 3 refills | Status: DC

## 2018-09-20 NOTE — Progress Notes (Signed)
Called the patient to inform her that our office has placed new protocols  for virtual  office visits. Due to the Covid 19 pandemic our office is reducing the  number of live patient visits in order to minimize the risk to our patients and healthcare providers.  Advised that our office is now providing the capability to offer the patients phone visits at this time. Informed of what that process looks like and that the virtual office visit will still be billed through insurance.   HIPPA- since the appointment is taking place over the phone, we can't guarantee the security of the phone line. With that said if we do move forward I would have to get verbal consent to completed the call over the phone. The Pt gave verbalized consent to complete the visit online/telephone. I have reviewed with her to the patient's best capability her chart and updated medication, allergies and history. Patient was at work and was unable to complete fully since she wasn't at home with her medication. She is a [place] patient and she questioned the visit being billed with [place] and I reviewed with Angie in billing you states that this visit can still take place.  I reviewed with the patient and had her write down the app to download for the video visit. I got her email [email or web address]. advised the patient that I will send a email and for the patient to be ready for her apt a little before her apt time and just join meeting. Pt verbalized understanding and was appreciative. Advised the patient to call if she had any questions.   Gerline Legacy, RN   Virtual Visit via Video Note  I connected with Elaine Hunter on 09/20/18 at  9:30 AM EDT by a video enabled telemedicine application and verified that I am speaking with the correct person using two identifiers.   I discussed the limitations of evaluation and management by telemedicine and the availability of in person appointments. The patient expressed understanding and agreed to  proceed.     SLEEP MEDICINE CLINIC   Provider:  Larey Seat, M D  Primary Care Physician:  Reid, [place], MD   Referring Provider: Reid, [place], MD - [facility]  Insomnia and sleep apnea. Chief complaint according to patient : I am always waking up at 5 AM, no matter how long I slept.   HPI:  Elaine Hunter is a 63 y.o. female patient seen in a video visit on 09-20-2018, The patient is a African-American right-handed female and is covered by the [employer].  She was referred for neurology OSA- sleep studies.  Sleep medical history The patient underwent a sleep study in 2015 at the time that her BMI was 40.9 kg/m her sleep study documented an AHI of 21 and a REM AHI of 29, and oxygen nadir of 75% and no periodic limb movement related arousals.  I did not have data available as to heart rate or how long the desaturation lasted.  The study was performed as a PSG on 09 October 2013.  She was educated about CPAP use on Oct 30, 2013 and she changed to a wisp nasal mask in November of the same year reported continued problems with insomnia and was referred for group visits with cognitive behavioral therapy in 2019 . In 2015 she had changed several times interfaces and she was finally changed to an [place] full face medium.  She developed a skin sensitivity to latex or silica and was changed to  a cloth mask that covers only the nose.  Compliance data through the [place] were last obtained on 06 December 2017 85% compliance but only 46% compliance for over 4 hours of nightly use.  Residual AHI was 2.2 and air leak time 1 minute 43 seconds.  She reported hypersomnolence.  The patient has a past medical history of thyroid cancer follicular type surgery in 1997 in 2015 she was diagnosed with a non-Hodgkin's lymphoma, has undergone hysterectomy continued allergic rhinitis, OSA, insomnia, GERD, vitamin D deficiency lower back pain.  Pain assessment at the [facility] was 4 out of 10 for intensity.   Sleep habits are as  follows: The patient reports that her dinnertime is usually around 6 PM but she is rarely in bed before midnight.  She always wakes up at 5 AM and it may take her longer than 30 minutes to fall asleep.  She prefers to sleep supine or on her side with only one pillow for head and neck support.  The CPAP has forced her to sleep more supine.  She has used a meditation program on a tablet to help her go to sleep but she does not eliminate the screen night from her bedroom. She is not sleeping well without CPAP; she will  Choke and snore -  She reports vivid dreams often of nightmarish content, and tinnitus.  As stated she will go to the bathroom only once and she wakes up at 5 AM.  This means that many nights she sleeps 4 hours or less.  She does sleep with CPAP in place to get the most compliance data. Denies palpitations, nausea or headache when she wakes, she is daytime sleepy.   Family sleep history:no apnea in relatives.    Social history: The patient is married with 3 children age 74, 54, 31, and 32 and has multiple grandchildren all healthy she is now living alone with her husband and her pet turtle.  She does not drink sodas she does not use any kind of tobacco no caffeine and no alcohol.  She will occasionally use a nasal spray fluticasone, she has not tried any sleep aids.   Review of Systems: Out of a complete 14 system review, the patient complains of only the following symptoms, and all other reviewed systems are negative.  hypersomnolence, insomnia chronic pain.   How likely are you to doze in the following situations: 0 = not likely, 1 = slight chance, 2 = moderate chance, 3 = high chance  Sitting and Reading?  Watching Television? Sitting inactive in a public place (theater or meeting)? Lying down in the afternoon when circumstances permit? Sitting and talking to someone? Sitting quietly after lunch without alcohol? In a car, while stopped for a few minutes in traffic? As a  passenger in a car for an hour without a break?  Total = the patient endorsed 17 out of 24 points in today's at worst, at the [place] last year she had endorsed 14 out of 24 points.  Especially concerning is that she has fallen asleep while driving, that she is not able to take a restorative nap because her mind is racing, she was just issued a new machine in September or October 2019 which has been set at 12.5 cm water pressure and she uses this was a cloth mask.  The new machine has not been followed up for compliance.  The sleep study was ordered in spite of the patient just having been given a new CPAP machine.  She continues to report tinnitus.    Social History   Socioeconomic History  . Marital status: Married    Spouse name: Not on file  . Number of children: Not on file  . Years of education: Not on file  . Highest education level: Not on file  Occupational History  . Not on file  Social Needs  . Financial resource strain: Not on file  . Food insecurity:    Worry: Not on file    Inability: Not on file  . Transportation needs:    Medical: Not on file    Non-medical: Not on file  Tobacco Use  . Smoking status: Never Smoker  . Smokeless tobacco: Never Used  Substance and Sexual Activity  . Alcohol use: No  . Drug use: No  . Sexual activity: Not on file  Lifestyle  . Physical activity:    Days per week: Not on file    Minutes per session: Not on file  . Stress: Not on file  Relationships  . Social connections:    Talks on phone: Not on file    Gets together: Not on file    Attends religious service: Not on file    Active member of club or organization: Not on file    Attends meetings of clubs or organizations: Not on file    Relationship status: Not on file  . Intimate partner violence:    Fear of current or ex partner: Not on file    Emotionally abused: Not on file    Physically abused: Not on file    Forced sexual activity: Not on file  Other Topics Concern  . Not  on file  Social History Narrative  . Not on file     Past Medical History:  Diagnosis Date  . Thyroid cancer ([place])   . Thyroid disease     Past Surgical History:  Procedure Laterality Date  . THYROIDECTOMY      Current Outpatient Medications  Medication Sig Dispense Refill  . cholecalciferol (VITAMIN D) 1000 units tablet Take 1,000 Units by mouth 2 (two) times daily.     Marland Kitchen levothyroxine (SYNTHROID, LEVOTHROID) 100 MCG tablet Take 100 mcg by mouth daily before breakfast.    . Multiple Vitamin (MULTIVITAMIN WITH MINERALS) TABS tablet Take 1 tablet by mouth daily.     No current facility-administered medications for this visit.     Allergies as of 09/20/2018 - Review Complete 09/19/2018  Allergen Reaction Noted  . Codeine Itching and Nausea And Vomiting 04/30/2015    Vitals: Last Weight:  Wt Readings from Last 1 Encounters:  05/03/16 230 lb 6 oz (104.5 kg)   HYW:VPXTG is no height or weight on file to calculate BMI.       Last Height:   Ht Readings from Last 1 Encounters:  05/03/16 5\' 3"  (1.6 m)    Physical exam:  General: The patient is awake, alert and appears not in acute distress. The patient is well groomed.  Nasal airflow patent , Retrognathia is mild, reports all teeth biological. Neurologic exam : The patient is awake and alert, oriented to place and time.   Memory subjective  described as intact.  Attention span & concentration ability appears normal.  Speech is fluent,  without  dysarthria, dysphonia or aphasia.  Mood and affect are appropriate.  Cranial nerves: Hearing : reported tinnitus.   Facial motor strength is symmetric and tongue and uvula move midline.   Gait and station: Patient walks without  assistive device and rose from her sofa without bracing herself.    Assessment:  After physical and neurologic examination, review of laboratory studies,  Personal review of imaging studies, reports of other /same  Imaging studies, results of  polysomnography and / or neurophysiology testing and pre-existing records as far as provided in visit., my assessment is   1) the patient indicated that she has lost weight her current weight is 196 pounds at a height of 5 foot 2 inches reducing her BMI to a category between 35 and 39.9.  She was able to demonstrate her airway to me during this virtual visit she has a Mallampati grade 3, and neck size of 16.5 inches, biological teeth. I will have to resume a home sleep test order for her during COVID-19 crisis, confirm which type of apnea she has and hopefully being able to distinguish between REM and non-REM apneas.  Based on this I will ask to set her current CPAP machine if possible to an auto titration setting.  If there is no strong REM dependence and no prolonged hypoxemia it would be also possible that the patient changes from CPAP to a dental device or even to an inspire procedure.  This also ended you of using any kind of facial application as she has apparently sensitive facial skin that has not responded well to standart CPAP masks, and  even to cloth masks.   2) insomnia will be addressed as a sleep hygiene issue, sleep habits, routines and rituals have to be established.  The patient agreed to advance her bedtime by an hour to about 11 PM this way she should be able to get 6 hours of sleep even if her usual rise time at 5 AM remains.  I will help her with the medication discussed below, trazodone.  3) continue weight loss. I am most happy for her reduction in BMI, it may also cause aerophagia at night if the setting f CPAP pressure is higher than currently needed.    Assessment and Plan: HST, sleep hygiene, trazodone.    Follow Up Instructions: I need her to advance her bedtime to 11 Pm, with the help of changes in exposure to electronics and screen light. I will order a sleep aid , TRAZODONE at 50 mg tab for her, but asked her to take it 1/2 tab each night at 10 PM, if not successful  will .    like to see this patient in 3 month from now- face to face. I     I discussed the assessment and treatment plan with the patient. The patient was provided an opportunity to ask questions and all were answered. The patient agreed with the plan and demonstrated an understanding of the instructions. The patient was advised to call back or seek an in-person evaluation if the symptoms worsen or if the condition fails to improve as anticipated.  I spent more than 30 minutes of non-face to face time with the patient.     Larey Seat, MD 8/67/6720, 94:70 AM  Certified in Neurology by ABPN Certified in [place] by [facility] [address], [place], [place] [address]

## 2018-09-24 ENCOUNTER — Telehealth: Payer: Self-pay | Admitting: Neurology

## 2018-09-24 ENCOUNTER — Other Ambulatory Visit: Payer: Self-pay | Admitting: Neurology

## 2018-09-24 DIAGNOSIS — E6609 Other obesity due to excess calories: Secondary | ICD-10-CM

## 2018-09-24 DIAGNOSIS — G4709 Other insomnia: Secondary | ICD-10-CM

## 2018-09-24 DIAGNOSIS — G4733 Obstructive sleep apnea (adult) (pediatric): Secondary | ICD-10-CM

## 2018-09-24 DIAGNOSIS — G4719 Other hypersomnia: Secondary | ICD-10-CM

## 2018-09-24 DIAGNOSIS — G4710 Hypersomnia, unspecified: Secondary | ICD-10-CM

## 2018-09-24 DIAGNOSIS — Z6838 Body mass index (BMI) 38.0-38.9, adult: Secondary | ICD-10-CM

## 2018-09-24 DIAGNOSIS — Z9989 Dependence on other enabling machines and devices: Secondary | ICD-10-CM

## 2018-09-24 NOTE — Telephone Encounter (Signed)
Pt was approved for an in lab only through the [place]. When they refer the pt over they go ahead and send auth over for the study. Pt can not have the hst. Please place order for in lab.

## 2018-09-24 NOTE — Telephone Encounter (Signed)
Order is placed for the inlab study. Will you please let the patient know that this is per insurance requirement

## 2018-09-24 NOTE — Telephone Encounter (Signed)
I called the pt an explained this to her. Pt verbalized understanding.

## 2018-11-28 ENCOUNTER — Ambulatory Visit (INDEPENDENT_AMBULATORY_CARE_PROVIDER_SITE_OTHER): Payer: No Typology Code available for payment source | Admitting: Neurology

## 2018-11-28 DIAGNOSIS — G4710 Hypersomnia, unspecified: Secondary | ICD-10-CM

## 2018-11-28 DIAGNOSIS — E6609 Other obesity due to excess calories: Secondary | ICD-10-CM

## 2018-11-28 DIAGNOSIS — G4733 Obstructive sleep apnea (adult) (pediatric): Secondary | ICD-10-CM

## 2018-11-28 DIAGNOSIS — Z6838 Body mass index (BMI) 38.0-38.9, adult: Secondary | ICD-10-CM

## 2018-11-28 DIAGNOSIS — G4709 Other insomnia: Secondary | ICD-10-CM

## 2018-11-28 DIAGNOSIS — G4719 Other hypersomnia: Secondary | ICD-10-CM

## 2018-12-03 DIAGNOSIS — G4709 Other insomnia: Secondary | ICD-10-CM | POA: Insufficient documentation

## 2018-12-03 DIAGNOSIS — G4733 Obstructive sleep apnea (adult) (pediatric): Secondary | ICD-10-CM | POA: Insufficient documentation

## 2018-12-03 DIAGNOSIS — G4710 Hypersomnia, unspecified: Secondary | ICD-10-CM | POA: Insufficient documentation

## 2018-12-03 DIAGNOSIS — G4719 Other hypersomnia: Secondary | ICD-10-CM | POA: Insufficient documentation

## 2018-12-03 NOTE — Addendum Note (Signed)
Addended by: Larey Seat on: 12/03/2018 06:28 PM   Modules accepted: Orders

## 2018-12-03 NOTE — Procedures (Signed)
PATIENT'S NAME:  Elaine Hunter, Elaine Hunter DOB:      03/08/1956      MR#:    [id number]     DATE OF RECORDING: 11/28/2018 REFERRING M.D.:  Niger Reid,  MD Study Performed:   Baseline Polysomnogram HISTORY:  Insomnia and sleep apnea: "I am always waking up at 5 AM, no matter how long I slept".    HPI:  Elaine Hunter is a 63 y.o. female patient seen in a video visit on 09-20-2018, The patient is an African-American right-handed female and is covered by the [employer].  She was referred for neurology OSA- sleep studies. The patient has a past medical history of thyroid cancer follicular type surgery in 1997 in 2015 she was diagnosed with a non-Hodgkin's lymphoma, has undergone hysterectomy continued allergic rhinitis, OSA, insomnia, GERD, vitamin D deficiency lower back pain.  [facility] assessment at the [facility] was 4 out of 10 for intensity.  The patient underwent a sleep study 10-09-2013; at the time her BMI was 40.9 kg/m and her sleep study documented an AHI of 21/h. and a REM AHI of 29/h.,   She was educated about CPAP use on Oct 30, 2013 and she changed to a wisp nasal mask in November of the same year reported continued problems with insomnia and was referred for group visits with cognitive behavioral therapy in 2019. In 2015 she was finally changed to an [place] full face medium.  She developed a skin sensitivity to latex or silica and was changed to a cloth mask that covers only the nose. Compliance data through the [place] were last obtained on 06 December 2017 85% compliance but only 46% compliance for over 4 hours of nightly use.  Residual AHI was 2.2 and air leak time 1 minute 43 seconds.  She reported hypersomnolence. She is not sleeping well without CPAP; She reports vivid dreams often of nightmarish content, and tinnitus.  As stated she will go to the bathroom only once and she wakes up at 5 AM.  This means that many nights she sleeps 4 hours or less  The patient endorsed the Epworth Sleepiness Scale at 17/24  points.   The patient's weight 230 pounds with a height of 63 (inches), resulting in a BMI of 40.6 kg/m2. The patient's neck circumference measured 14.5 inches.  CURRENT MEDICATIONS: Synthroid   PROCEDURE:  This is a multichannel digital polysomnogram utilizing the Somnostar 11.2 system.  Electrodes and sensors were applied and monitored per AASM Specifications.   EEG, EOG, Chin and Limb EMG, were sampled at 200 Hz.  ECG, Snore and Nasal Pressure, Thermal Airflow, Respiratory Effort, CPAP Flow and Pressure, Oximetry was sampled at 50 Hz. Digital video and audio were recorded.      BASELINE STUDY Lights Out was at 22:58 and Lights On at 05:00.  Total recording time (TRT) was 362.5 minutes, with a total sleep time (TST) of 289 minutes.   The patient's sleep latency was 13 minutes.  REM latency was 175 minutes.  The sleep efficiency was 79.7 %.   SLEEP ARCHITECTURE: WASO (Wake after sleep onset) was 66.5 minutes.  There were 45.5 minutes in Stage N1, 81.5 minutes Stage N2, 119 minutes Stage N3 and 43 minutes in Stage REM.  The percentage of Stage N1 was 15.7%, Stage N2 was 28.2%, Stage N3 was 41.2% and Stage R (REM sleep) was 14.9%.   RESPIRATORY ANALYSIS:  There were a total of 82 respiratory events:  33 obstructive apneas, 0 central apneas and 0 mixed  apneas with a total of 33 apneas and an apnea index (AI) of 6.9 /hour. There were 49 hypopneas with a hypopnea index of 10.2 /hour. The patient also had 0 respiratory event related arousals (RERAs).     The total APNEA/HYPOPNEA INDEX (AHI) was 17.0 /hour and the total RESPIRATORY DISTURBANCE INDEX was 17. 0/hour.  34 events occurred in REM sleep and 84 events in NREM. The REM AHI was 47.4 /hour, versus a non-REM AHI of 11.7. The patient spent 289 minutes of total sleep time in the supine position and 0 minutes in non-supine. The supine AHI was 17.1 versus a non-supine AHI of 0.0.  OXYGEN SATURATION & C02:  The Wake baseline 02 saturation was 99%, with the  lowest being 40%. Time spent below 89% saturation equaled 44 minutes.  The arousals were noted as: 82 were spontaneous, 0 were associated with PLMs, and 57 were associated with respiratory events. Sleep was very fragmented and hypoxia was noted during REM sleep to be most severe. Audio and video analysis did not show any abnormal or unusual movements, behaviors, phonations or vocalizations.  Snoring was barely audible. EKG was in keeping with normal sinus rhythm (NSR).  IMPRESSION:  1. Mild to moderate Obstructive Sleep Apnea (OSA) at AHI 17.0/h. and REM accentuated to AHI 47.4/h.  2. 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 Normal EKG 4. Fragmented sleep   RECOMMENDATIONS:  1. Advise either autotitration or full-night, attended, CPAP titration study to optimize therapy. A dental device will not correct hypoxemia or REM dependent apnea. 2. Weight loss therapy is strongly recommended- consider weight and wellness approach. 3. The patient may be a candidate for the Inspire procedure if she is successful in reducing her BMI under 35kg/m2.    I certify that I have reviewed the entire raw data recording prior to the issuance of this report in accordance with the Standards of Accreditation of the American Academy of Sleep Medicine (AASM)   Larey Seat, MD   12-03-2018 Diplomat, American Board of Psychiatry and Neurology  Diplomat, American Board of [place] Director, [employer] [facility] [employer]

## 2018-12-06 ENCOUNTER — Telehealth: Payer: Self-pay | Admitting: Neurology

## 2018-12-06 NOTE — Telephone Encounter (Signed)
-----   Message from Larey Seat, MD sent at 12/03/2018  6:28 PM EDT ----- IMPRESSION:  1. Mild to moderate Obstructive Sleep Apnea (OSA) at AHI 17.0/h.  and REM accentuated to AHI 47.4/h.  2. 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 Normal EKG 4. Fragmented sleep   RECOMMENDATIONS:  1. Advise either autotitration or full-night, attended, CPAP  titration study to optimize therapy. I will order an autotitration device , 5-16 cm water, 3 cm EPR and mask of choice.   A dental device will not  correct hypoxemia or REM dependent apnea. 2. Weight loss therapy is strongly recommended- consider weight  and wellness approach. 3. The patient may be a candidate for the Inspire procedure if  she is successful in reducing her BMI under 35kg/m2.    Patient referred by Dr. Joneen Caraway, seen by me on video visit. Please call and notify the patient that the recent  sleep test 11-28-2018 did show significant obstructive sleep apnea.  A copy of the report will be sent to the patient, the PCP and referring MD, if other than PCP.  The referring physician is with the [place].  Once you have spoken to patient, you can close this encounter.   Thanks, Larey Seat, MD  Certified in Neurology by ABPN Certified in [place] by [facility] [address] [place], [place] [address]

## 2018-12-06 NOTE — Telephone Encounter (Signed)
I called pt. I advised pt that Dr. Brett Fairy reviewed their sleep study results and found that pt has sleep apnea. Dr. Brett Fairy recommends that pt starts auto CPAP. I reviewed PAP compliance expectations with the pt. Pt is agreeable to starting a CPAP. I advised pt that an order will be sent to a DME, [facility], and [facility] will call the pt within about one week after they file with the pt's insurance. [facility] will show the pt how to use the machine, fit for masks, and troubleshoot the CPAP if needed. A follow up appt was made for insurance purposes with Ward Givens on Aug 20,2020 at 8:30 am. Pt verbalized understanding to arrive 15 minutes early and bring their CPAP. A letter with all of this information in it will be mailed to the pt as a reminder. I verified with the pt that the address we have on file is correct. Pt verbalized understanding of results. Pt had no questions at this time but was encouraged to call back if questions arise. I have sent the order to [facility] and have received confirmation that they have received the order.

## 2018-12-26 ENCOUNTER — Telehealth: Payer: Self-pay | Admitting: Neurology

## 2018-12-26 NOTE — Telephone Encounter (Signed)
Order has been sent to the number provided below for the patient. As long as patient is set up by 01/13/19 with the CPAP her 8/20 apt will be ok for insurance purposes.

## 2018-12-26 NOTE — Telephone Encounter (Signed)
The pt called and stated that she needs the rx for her cpap machine sent to the [facility] not [facility]. She stated the fax number to the [place] [facility] is [phone].

## 2019-01-03 ENCOUNTER — Telehealth: Payer: Self-pay | Admitting: Neurology

## 2019-01-03 ENCOUNTER — Other Ambulatory Visit: Payer: Self-pay | Admitting: Neurology

## 2019-01-03 MED ORDER — TRAZODONE HCL 50 MG PO TABS: 50.0000 mg | ORAL_TABLET | Freq: Every day | ORAL | 3 refills | Status: AC

## 2019-01-03 NOTE — Telephone Encounter (Signed)
The pt called and stated that the rx for the Trazodone that Dr. Brett Fairy prescribed needs to be faxed to the pharmacy at the [facility]. The fax number is [phone]

## 2019-01-03 NOTE — Telephone Encounter (Signed)
Prescription will be printed and faxed to the [place] at the number listed below for the patient.

## 2019-02-14 ENCOUNTER — Telehealth: Payer: Self-pay | Admitting: Neurology

## 2019-02-14 ENCOUNTER — Encounter: Payer: Non-veteran care | Admitting: Adult Health

## 2019-02-14 ENCOUNTER — Encounter: Payer: Self-pay | Admitting: Adult Health

## 2019-02-14 ENCOUNTER — Other Ambulatory Visit: Payer: Self-pay

## 2019-02-14 NOTE — Telephone Encounter (Signed)
In July we had sent order to [facility] to get the pt established with a auto CPAP. Pt never indicated that she already had a CPAP through the [place]. I spoke with Josem Kaufmann at the [facility] with the CPAP dept. Patient was just seen in the office at the [facility] for apt on 02/13/19 for follow up on her CPAP. Patient was set up with a auto CPAP 11/2017. She never started a new machine. Patient needs to keep and continue to follow up with the [place] for future CPAP needs since she is still seeing them and established with them. Her insurance will not cover for her to follow here and there for same thing. [facility] through them. Please cancel the sept 17th apt and [facility] she will need to [facility] to follow [facility] for [facility] needs.

## 2019-02-14 NOTE — Progress Notes (Signed)
This encounter was created in error - please disregard.

## 2019-03-14 ENCOUNTER — Ambulatory Visit: Payer: Self-pay | Admitting: Family Medicine
